# Patient Record
Sex: Female | Born: 1995 | Race: Black or African American | Hispanic: No | Marital: Single | Smoking: Never smoker
Health system: Southern US, Community
[De-identification: ages and names within clinical notes are randomized; demographics above are authoritative.]

---

## 2016-08-14 ENCOUNTER — Emergency Department (HOSPITAL_COMMUNITY)
Admission: EM | Admit: 2016-08-14 | Discharge: 2016-08-15 | Disposition: A | Discharge status: Home / Self Care | Payer: Medicaid Other | Attending: Emergency Medicine | Admitting: Emergency Medicine

## 2016-08-14 DIAGNOSIS — F10129 Alcohol abuse with intoxication, unspecified: Secondary | ICD-10-CM | POA: Diagnosis not present

## 2016-08-14 DIAGNOSIS — R112 Nausea with vomiting, unspecified: Secondary | ICD-10-CM | POA: Insufficient documentation

## 2016-08-14 DIAGNOSIS — F10929 Alcohol use, unspecified with intoxication, unspecified: Secondary | ICD-10-CM

## 2016-08-14 MED ORDER — ONDANSETRON HCL 40 MG/20ML IJ SOLN
8.0000 mg | Freq: Once | INTRAMUSCULAR | Status: AC
  Administered 2016-08-15: 8 mg via INTRAVENOUS
  Filled 2016-08-14: qty 4

## 2016-08-14 MED ORDER — PANTOPRAZOLE SODIUM 40 MG IV SOLR
40.0000 mg | Freq: Once | INTRAVENOUS | Status: AC
  Administered 2016-08-15: 40 mg via INTRAVENOUS
  Filled 2016-08-14: qty 40

## 2016-08-14 MED ORDER — SODIUM CHLORIDE 0.9 % IV BOLUS (SEPSIS)
500.0000 mL | Freq: Once | INTRAVENOUS | Status: AC
  Administered 2016-08-15: 500 mL via INTRAVENOUS

## 2016-08-14 MED ORDER — AMMONIA AROMATIC IN INHA
RESPIRATORY_TRACT | Status: AC
  Filled 2016-08-14: qty 10

## 2016-08-14 NOTE — ED Provider Notes (Signed)
[facility]-EMERGENCY DEPT Provider Note   CSN: [id number] Arrival date & time: 08/14/16  2317     History   Chief Complaint Chief Complaint  Patient presents with  . Alcohol Intoxication    HPI Felicia Crawford is a 21 y.o. female. Chief complaint is intoxicated and vomiting  HPI: Patient brought to emergency by her friends. Did multiple shots tonight and was vomiting uncontrollably. Per friends no drug use. Per friends no significant past medical history.  No past medical history on file.  There are no active problems to display for this patient.   No past surgical history on file.  OB History    No data available       Home Medications    Prior to Admission medications   Not on File    Family History No family history on file.  Social History Social History  Substance Use Topics  . Smoking status: Not on file  . Smokeless tobacco: Not on file  . Alcohol use Not on file     Allergies   Patient has no allergy information on record.   Review of Systems Review of SystemsLevel V for intoxication   Physical Exam Updated Vital Signs BP 107/82   Pulse 85   Temp 97.4 F (36.3 C) (Oral)   Resp (!) 22   Ht  (1.676 m)   Wt 125 lb (56.7 kg)   LMP  (LMP Unknown)   SpO2 97%   BMI 20.18 kg/m   Physical Exam  Constitutional: She appears well-developed and well-nourished. No distress.  HENT:  Head: Normocephalic.  Positive gag. No emesis here. Had emesis triage. Clear lungs. No increased work of breathing.  Eyes: Conjunctivae are normal. Pupils are equal, round, and reactive to light. No scleral icterus.  Neck: Normal range of motion. Neck supple. No thyromegaly present.  Cardiovascular: Normal rate and regular rhythm.  Exam reveals no gallop and no friction rub.   No murmur heard. Pulmonary/Chest: Effort normal and breath sounds normal. No respiratory distress. She has no wheezes. She has no rales.  Abdominal: Soft. Bowel sounds are normal. She  exhibits no distension. There is no tenderness. There is no rebound.  Musculoskeletal: Normal range of motion.  Neurological:  Somnolent  Skin: Skin is warm and dry. No rash noted.     ED Treatments / Results  Labs (all labs ordered are listed, but only abnormal results are displayed) Labs Reviewed - No data to display  EKG  EKG Interpretation None       Radiology No results found.  Procedures Procedures (including critical care time)  Medications Ordered in ED Medications  ammonia inhalant (not administered)  sodium chloride 0.9 % bolus 500 mL (not administered)  ondansetron (ZOFRAN) 8 mg in sodium chloride 0.9 % 50 mL IVPB (not administered)  pantoprazole (PROTONIX) injection 40 mg (not administered)     Initial Impression / Assessment and Plan / ED Course  I have reviewed the triage vital signs and the nursing notes.  Pertinent labs & imaging results that were available during my care of the patient were reviewed by me and considered in my medical decision making (see chart for details).   Plan:   antiemetics. Observation until awake and alert and appropriate for discharge.  Final Clinical Impressions(s) / ED Diagnoses   Final diagnoses:  Alcoholic intoxication with complication (HCC)  Non-intractable vomiting with nausea, unspecified vomiting type    New Prescriptions New Prescriptions   No medications on file  Rolland Porter, MD 08/14/16 [phone]

## 2016-08-14 NOTE — ED Triage Notes (Signed)
Pt presents with friends for alcohol intoxication. Pt having active vomiting with pink colored emesis. Pt friends stated that she had 6 shots of liquor tonight. Unable to obtain any prior hx.

## 2016-08-15 NOTE — Discharge Instructions (Signed)
No alcohol. Push fluids tomorrow.

## 2016-08-19 ENCOUNTER — Emergency Department (HOSPITAL_COMMUNITY): Payer: Medicaid Other

## 2016-08-19 ENCOUNTER — Encounter (HOSPITAL_COMMUNITY): Payer: Self-pay | Admitting: *Deleted

## 2016-08-19 ENCOUNTER — Emergency Department (HOSPITAL_COMMUNITY)
Admission: EM | Admit: 2016-08-19 | Discharge: 2016-08-19 | Disposition: A | Discharge status: Home / Self Care | Payer: Medicaid Other | Attending: Emergency Medicine | Admitting: Emergency Medicine

## 2016-08-19 DIAGNOSIS — Y999 Unspecified external cause status: Secondary | ICD-10-CM | POA: Diagnosis not present

## 2016-08-19 DIAGNOSIS — Y9389 Activity, other specified: Secondary | ICD-10-CM | POA: Diagnosis not present

## 2016-08-19 DIAGNOSIS — X509XXA Other and unspecified overexertion or strenuous movements or postures, initial encounter: Secondary | ICD-10-CM | POA: Insufficient documentation

## 2016-08-19 DIAGNOSIS — Y9289 Other specified places as the place of occurrence of the external cause: Secondary | ICD-10-CM | POA: Diagnosis not present

## 2016-08-19 DIAGNOSIS — S93491A Sprain of other ligament of right ankle, initial encounter: Secondary | ICD-10-CM | POA: Insufficient documentation

## 2016-08-19 DIAGNOSIS — S99911A Unspecified injury of right ankle, initial encounter: Secondary | ICD-10-CM | POA: Diagnosis present

## 2016-08-19 NOTE — ED Provider Notes (Signed)
[facility]-EMERGENCY DEPT Provider Note   CSN: [id number] Arrival date & time: 08/19/16  4098   By signing my name below, I, Talbert Nan, attest that this documentation has been prepared under the direction and in the presence of Noele Icenhour, PA-C. Electronically Signed: Talbert Nan, Scribe. 08/19/16. 11:09 AM.    History   Chief Complaint Chief Complaint  Patient presents with  . Ankle Pain    HPI Felicia Crawford is a 21 y.o. female who presents to the Emergency Department complaining of sudden onset, moderate, 3/10 severity right ankle pain s/p mechanical fall when playing a contact sport last night. She states she heard her ankle pop. Ice applied in the ED with slight relief. She report that she has been using ice and aleve PTA with slight relief. Pt denies neuro deficits, neck/back pain, head injury, or any other complaints. Pt states that she has a PCP.    The history is provided by the patient. No language interpreter was used.    History reviewed. No pertinent past medical history.  There are no active problems to display for this patient.   History reviewed. No pertinent surgical history.  OB History    No data available       Home Medications    Prior to Admission medications   Not on File    Family History No family history on file.  Social History Social History  Substance Use Topics  . Smoking status: Never Smoker  . Smokeless tobacco: Never Used  . Alcohol use Yes     Allergies   Patient has no allergy information on record.   Review of Systems Review of Systems  Musculoskeletal: Positive for arthralgias and joint swelling. Negative for back pain and neck pain.  Skin: Negative for wound.  Neurological: Negative for weakness and numbness.     Physical Exam Updated Vital Signs BP 130/82 (BP Location: Right Arm)   Pulse 85   Temp 98.8 F (37.1 C) (Oral)   Resp 17   Ht  (1.6 m)   Wt 145 lb (65.8 kg)   LMP 07/23/2016   SpO2 99%    BMI 25.69 kg/m   Physical Exam  Constitutional: She appears well-developed and well-nourished. No distress.  HENT:  Head: Normocephalic and atraumatic.  Eyes: Conjunctivae are normal.  Neck: Neck supple.  Cardiovascular: Normal rate, regular rhythm and intact distal pulses.   Pulmonary/Chest: Effort normal.  Musculoskeletal: She exhibits edema and tenderness. She exhibits no deformity.  Tenderness over the distal achilles. Normal Thompson test. Plantar and dorsal flexion intact. Good distal pulses. Cap refill <2 sec. Swelling and tenderness over the right lateral malleolus. No noted deformity, crepitus, or instability.  Neurological: She is alert.  No sensory deficits in the lower extremities. Strength 5 out of 5.  Skin: Skin is warm and dry. Capillary refill takes less than 2 seconds. She is not diaphoretic.  Psychiatric: She has a normal mood and affect. Her behavior is normal.  Nursing note and vitals reviewed.    ED Treatments / Results   DIAGNOSTIC STUDIES: Oxygen Saturation is 99% on room air, normal by my interpretation.    COORDINATION OF CARE: 11:09 AM Discussed treatment plan with pt at bedside and pt agreed to plan.  Labs (all labs ordered are listed, but only abnormal results are displayed) Labs Reviewed - No data to display  EKG  EKG Interpretation None       Radiology Dg Ankle Complete Right  Result Date: 08/19/2016 CLINICAL  DATA:  Twisted ankle last night. Lateral pain and swelling. EXAM: RIGHT ANKLE - COMPLETE 3+ VIEW COMPARISON:  None. FINDINGS: Lateral soft tissue swelling.  No fracture or dislocation. IMPRESSION: Lateral soft tissue swelling. Electronically Signed   By: Paulina Fusi M.D.   On: 08/19/2016 10:31    Procedures Procedures (including critical care time)  Medications Ordered in ED Medications - No data to display   Initial Impression / Assessment and Plan / ED Course  I have reviewed the triage vital signs and the nursing  notes.  Pertinent labs & imaging results that were available during my care of the patient were reviewed by me and considered in my medical decision making (see chart for details).     Patient presents with right ankle injury. No fracture or dislocation noted on x-ray. Ankle brace and crutches. Orthopedic follow-up. The patient was given instructions for home care as well as return precautions. Patient voices understanding of these instructions, accepts the plan, and is comfortable with discharge.   Final Clinical Impressions(s) / ED Diagnoses   Final diagnoses:  Sprain of other ligament of right ankle, initial encounter    New Prescriptions There are no discharge medications for this patient.  I personally performed the services described in this documentation, which was scribed in my presence. The recorded information has been reviewed and is accurate.    Anselm Pancoast, PA-C 08/19/16 1338    Benjiman Core, MD 08/19/16 [phone]

## 2016-08-19 NOTE — ED Notes (Signed)
Pt is in stable condition upon d/c and ambulates from ED. 

## 2016-08-19 NOTE — Discharge Instructions (Signed)
You have been seen today for an ankle injury. There were no acute abnormalities on the x-rays, including no sign of fracture or dislocation. Pain: May take ibuprofen or naproxen to reduce pain and inflammation. Take these types of medications with food to avoid upset stomach. Choose one of these medications, but not both.  Ice: May apply ice to the area over the next 24 hours for 15 minutes at a time to reduce swelling. Elevation: Keep the extremity elevated as often as possible to reduce pain and inflammation. Splint: Wear the ankle splint for support and comfort. Wear this until pain resolves. Exercises: Start by performing these exercises a few times a week, increasing the frequency until you are performing them twice daily.  Follow up: If symptoms are improving, you may follow up with your primary care provider for any continued management. If symptoms are not improving, you may follow up with the orthopedic specialist. If symptoms are not improving, you may need a repeat xray.

## 2016-08-19 NOTE — ED Notes (Signed)
Right foot elevated and ice pack applied to ankle

## 2016-08-19 NOTE — ED Triage Notes (Signed)
States she was playing a game last pm and twisted her right ankle , states she heard her ankle pop

## 2018-09-18 IMAGING — DX DG ANKLE COMPLETE 3+V*R*
3 series · 3 of 3 positions shown · non-contrast
Comparison: None.

CLINICAL DATA: Twisted ankle last night. Lateral pain and swelling.

EXAM:
RIGHT ANKLE - COMPLETE 3+ VIEW

[ankle ap]
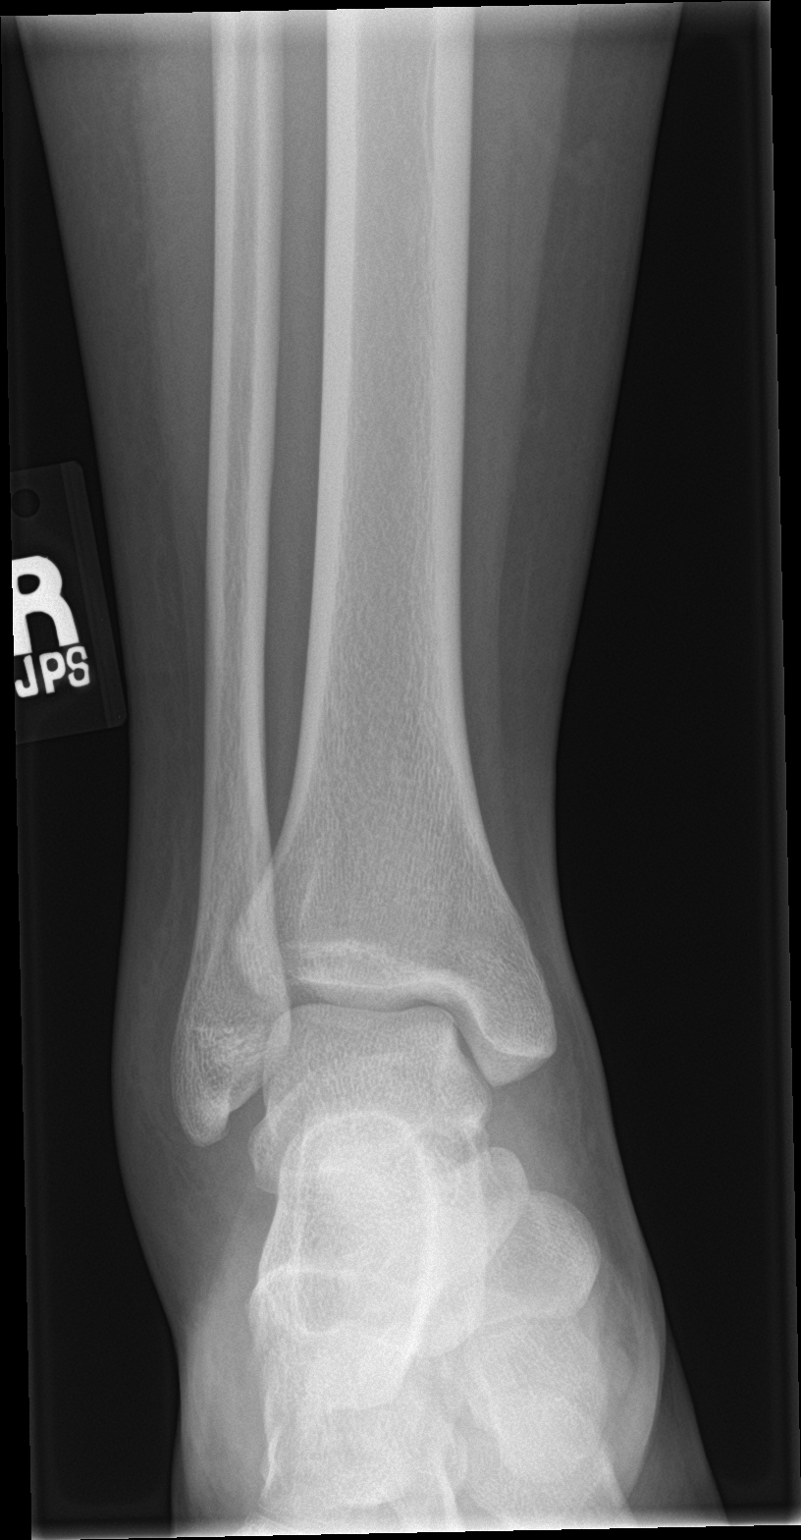

[ankle obl]
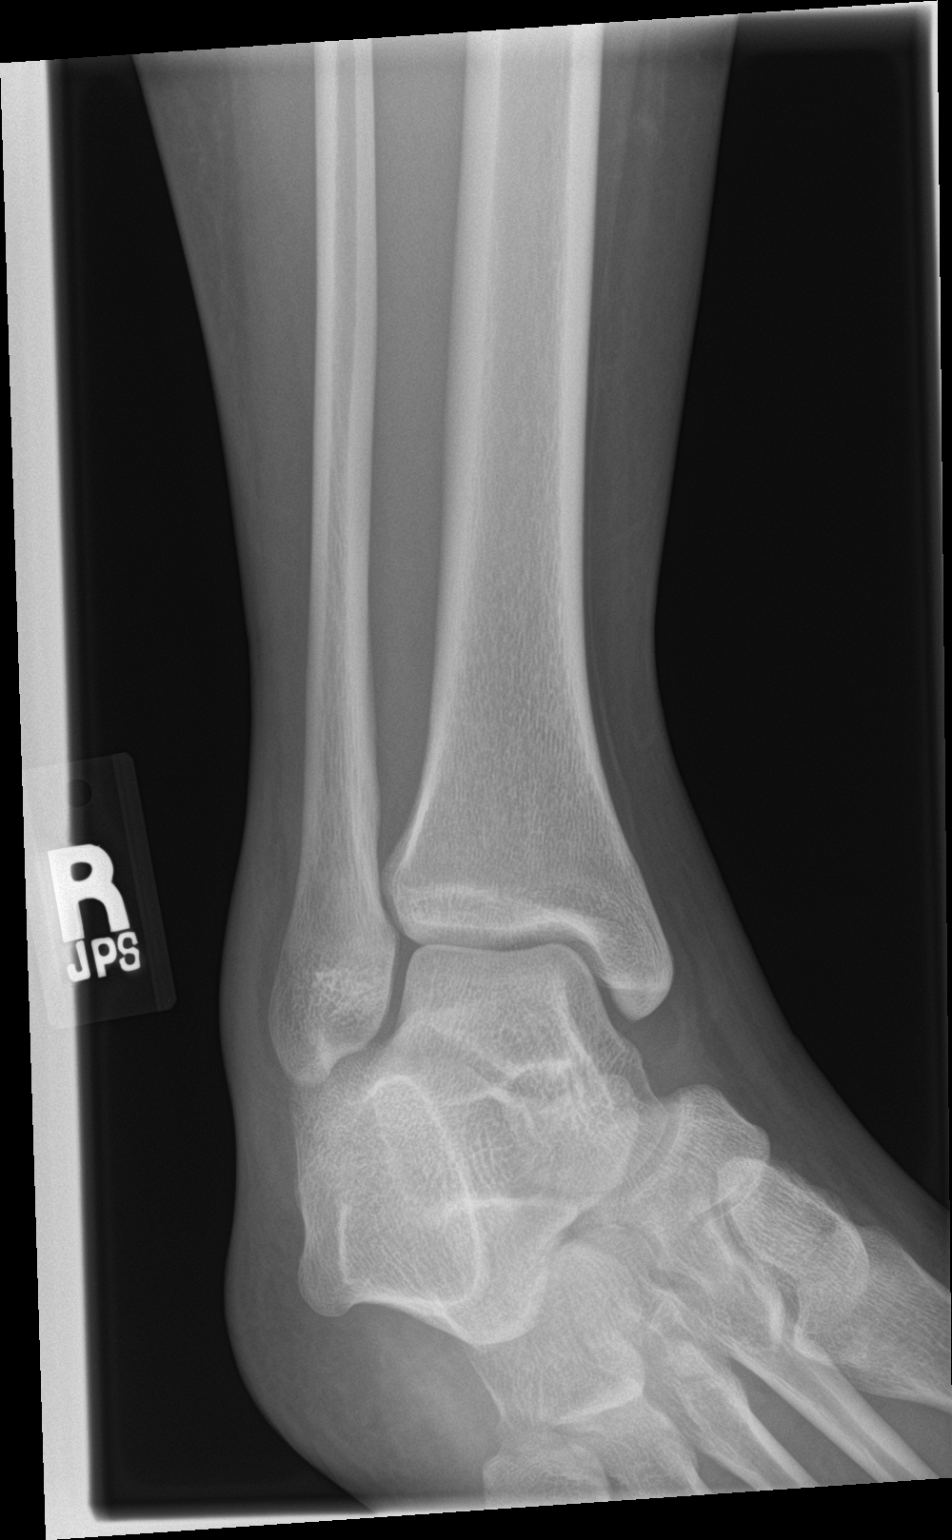

[ankle lat]
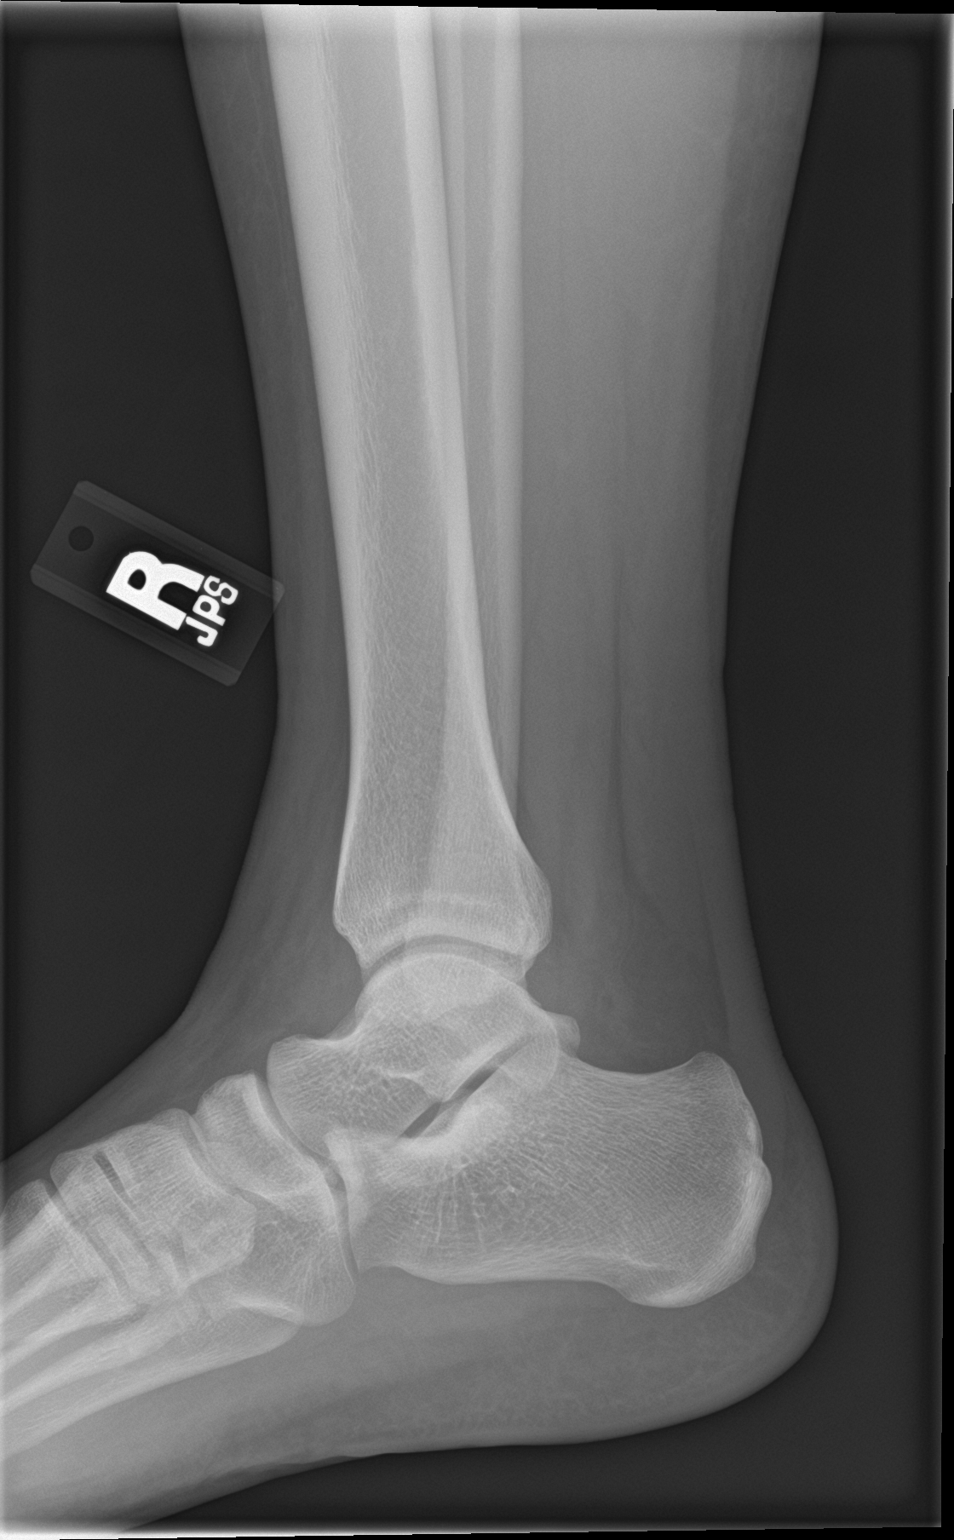

[3 of 3 positions shown; findings below may reference images not displayed]

FINDINGS: Lateral soft tissue swelling.  No fracture or dislocation.
IMPRESSION: Lateral soft tissue swelling.
